# Patient Record
Sex: Female | Born: 1975 | Race: White | Hispanic: No | Marital: Married | Smoking: Never smoker
Health system: Southern US, Community
[De-identification: ages and names within clinical notes are randomized; demographics above are authoritative.]

## PROBLEM LIST (undated history)

## (undated) DIAGNOSIS — R140 Abdominal distension (gaseous): Secondary | ICD-10-CM

## (undated) DIAGNOSIS — K219 Gastro-esophageal reflux disease without esophagitis: Secondary | ICD-10-CM

## (undated) DIAGNOSIS — T7840XA Allergy, unspecified, initial encounter: Secondary | ICD-10-CM

## (undated) DIAGNOSIS — R197 Diarrhea, unspecified: Secondary | ICD-10-CM

## (undated) DIAGNOSIS — T883XXA Malignant hyperthermia due to anesthesia, initial encounter: Secondary | ICD-10-CM

## (undated) HISTORY — PX: DILATION AND CURETTAGE OF UTERUS: SHX78

## (undated) HISTORY — PX: TONSILLECTOMY: SUR1361

## (undated) HISTORY — DX: Allergy, unspecified, initial encounter: T78.40XA

## (undated) HISTORY — DX: Abdominal distension (gaseous): R14.0

## (undated) HISTORY — DX: Diarrhea, unspecified: R19.7

## (undated) HISTORY — DX: Gastro-esophageal reflux disease without esophagitis: K21.9

---

## 2017-03-13 ENCOUNTER — Other Ambulatory Visit: Payer: Self-pay | Admitting: Obstetrics and Gynecology

## 2017-03-13 DIAGNOSIS — R928 Other abnormal and inconclusive findings on diagnostic imaging of breast: Secondary | ICD-10-CM

## 2017-03-30 ENCOUNTER — Other Ambulatory Visit: Payer: Self-pay

## 2017-03-31 ENCOUNTER — Ambulatory Visit
Admission: RE | Admit: 2017-03-31 | Discharge: 2017-03-31 | Disposition: A | Discharge status: Home / Self Care | Payer: PRIVATE HEALTH INSURANCE | Source: Ambulatory Visit | Attending: Obstetrics and Gynecology | Admitting: Obstetrics and Gynecology

## 2017-03-31 DIAGNOSIS — R928 Other abnormal and inconclusive findings on diagnostic imaging of breast: Secondary | ICD-10-CM

## 2017-10-04 ENCOUNTER — Encounter (HOSPITAL_COMMUNITY): Payer: Self-pay | Admitting: *Deleted

## 2017-10-04 ENCOUNTER — Other Ambulatory Visit: Payer: Self-pay

## 2017-10-04 ENCOUNTER — Observation Stay (HOSPITAL_COMMUNITY)
Admission: EM | Admit: 2017-10-04 | Discharge: 2017-10-06 | Disposition: A | Discharge status: Home / Self Care | Payer: PRIVATE HEALTH INSURANCE | Attending: Internal Medicine | Admitting: Internal Medicine

## 2017-10-04 ENCOUNTER — Emergency Department (HOSPITAL_COMMUNITY): Payer: PRIVATE HEALTH INSURANCE

## 2017-10-04 DIAGNOSIS — R946 Abnormal results of thyroid function studies: Secondary | ICD-10-CM | POA: Insufficient documentation

## 2017-10-04 DIAGNOSIS — J341 Cyst and mucocele of nose and nasal sinus: Secondary | ICD-10-CM | POA: Diagnosis not present

## 2017-10-04 DIAGNOSIS — C4910 Malignant neoplasm of connective and soft tissue of unspecified upper limb, including shoulder: Secondary | ICD-10-CM | POA: Diagnosis present

## 2017-10-04 DIAGNOSIS — Z79899 Other long term (current) drug therapy: Secondary | ICD-10-CM | POA: Diagnosis not present

## 2017-10-04 DIAGNOSIS — M6282 Rhabdomyolysis: Principal | ICD-10-CM

## 2017-10-04 DIAGNOSIS — M79601 Pain in right arm: Secondary | ICD-10-CM

## 2017-10-04 HISTORY — DX: Malignant hyperthermia due to anesthesia, initial encounter: T88.3XXA

## 2017-10-04 LAB — CBC WITH DIFFERENTIAL/PLATELET
BASOS PCT: 0 %
Basophils Absolute: 0 10*3/uL (ref 0.0–0.1)
EOS ABS: 0.1 10*3/uL (ref 0.0–0.7)
EOS PCT: 3 %
HEMATOCRIT: 41.2 % (ref 36.0–46.0)
Hemoglobin: 13.5 g/dL (ref 12.0–15.0)
Lymphocytes Relative: 22 %
Lymphs Abs: 0.9 10*3/uL (ref 0.7–4.0)
MCH: 29.5 pg (ref 26.0–34.0)
MCHC: 32.8 g/dL (ref 30.0–36.0)
MCV: 90 fL (ref 78.0–100.0)
MONO ABS: 0.2 10*3/uL (ref 0.1–1.0)
Monocytes Relative: 5 %
Neutro Abs: 3 10*3/uL (ref 1.7–7.7)
Neutrophils Relative %: 70 %
PLATELETS: 235 10*3/uL (ref 150–400)
RBC: 4.58 MIL/uL (ref 3.87–5.11)
RDW: 13.8 % (ref 11.5–15.5)
WBC: 4.3 10*3/uL (ref 4.0–10.5)

## 2017-10-04 LAB — BASIC METABOLIC PANEL
ANION GAP: 11 (ref 5–15)
BUN: 13 mg/dL (ref 6–20)
CHLORIDE: 103 mmol/L (ref 101–111)
CO2: 26 mmol/L (ref 22–32)
Calcium: 9.3 mg/dL (ref 8.9–10.3)
Creatinine, Ser: 0.9 mg/dL (ref 0.44–1.00)
GFR calc non Af Amer: >60 mL/min (ref >60)
GLUCOSE: 99 mg/dL (ref 65–99)
POTASSIUM: 3.8 mmol/L (ref 3.5–5.1)
Sodium: 140 mmol/L (ref 135–145)

## 2017-10-04 LAB — URINALYSIS, ROUTINE W REFLEX MICROSCOPIC
Bilirubin Urine: NEGATIVE
Glucose, UA: NEGATIVE mg/dL
Hgb urine dipstick: NEGATIVE
Ketones, ur: NEGATIVE mg/dL
LEUKOCYTES UA: NEGATIVE
Nitrite: NEGATIVE
PH: 6 (ref 5.0–8.0)
Protein, ur: NEGATIVE mg/dL
SPECIFIC GRAVITY, URINE: 1.004 — AB (ref 1.005–1.030)

## 2017-10-04 LAB — CK: Total CK: 6227 U/L — ABNORMAL HIGH (ref 38–234)

## 2017-10-04 LAB — POC URINE PREG, ED: PREG TEST UR: NEGATIVE

## 2017-10-04 LAB — TSH: TSH: 7.014 u[IU]/mL — ABNORMAL HIGH (ref 0.350–4.500)

## 2017-10-04 MED ORDER — SODIUM CHLORIDE 0.9 % IV SOLN
INTRAVENOUS | Status: DC
  Administered 2017-10-04 – 2017-10-05 (×3): via INTRAVENOUS

## 2017-10-04 MED ORDER — ACETAMINOPHEN 650 MG RE SUPP
650.0000 mg | Freq: Four times a day (QID) | RECTAL | Status: DC | PRN
Start: 2017-10-04 — End: 2017-10-06

## 2017-10-04 MED ORDER — SODIUM CHLORIDE 0.9 % IV SOLN
Freq: Once | INTRAVENOUS | Status: AC
  Administered 2017-10-04: 14:00:00 via INTRAVENOUS

## 2017-10-04 MED ORDER — MORPHINE SULFATE (PF) 4 MG/ML IV SOLN: 2.0000 mg | INTRAVENOUS | Status: DC | PRN

## 2017-10-04 MED ORDER — POLYETHYLENE GLYCOL 3350 17 G PO PACK: 17.0000 g | PACK | Freq: Every day | ORAL | Status: DC | PRN

## 2017-10-04 MED ORDER — IOPAMIDOL (ISOVUE-300) INJECTION 61%
INTRAVENOUS | Status: AC
  Filled 2017-10-04: qty 100

## 2017-10-04 MED ORDER — SODIUM CHLORIDE 0.9 % IV SOLN
Freq: Once | INTRAVENOUS | Status: AC
Start: 2017-10-04 — End: 2017-10-04
  Administered 2017-10-04: 14:00:00 via INTRAVENOUS

## 2017-10-04 MED ORDER — ENOXAPARIN SODIUM 40 MG/0.4ML ~~LOC~~ SOLN
40.0000 mg | SUBCUTANEOUS | Status: DC
Start: 2017-10-04 — End: 2017-10-06
  Administered 2017-10-04 – 2017-10-05 (×2): 40 mg via SUBCUTANEOUS
  Filled 2017-10-04 (×2): qty 0.4

## 2017-10-04 MED ORDER — IOPAMIDOL (ISOVUE-300) INJECTION 61%
100.0000 mL | Freq: Once | INTRAVENOUS | Status: AC | PRN
  Administered 2017-10-04: 100 mL via INTRAVENOUS

## 2017-10-04 MED ORDER — ACETAMINOPHEN 325 MG PO TABS
650.0000 mg | ORAL_TABLET | Freq: Four times a day (QID) | ORAL | Status: DC | PRN
  Administered 2017-10-04 – 2017-10-05 (×2): 650 mg via ORAL
  Filled 2017-10-04 (×2): qty 2

## 2017-10-04 MED ORDER — TRAMADOL HCL 50 MG PO TABS
50.0000 mg | ORAL_TABLET | Freq: Four times a day (QID) | ORAL | Status: DC | PRN
  Administered 2017-10-04 – 2017-10-05 (×3): 50 mg via ORAL
  Filled 2017-10-04 (×3): qty 1

## 2017-10-04 NOTE — ED Notes (Signed)
ED Provider at bedside. 

## 2017-10-04 NOTE — ED Provider Notes (Signed)
[facility] DEPT Provider Note   CSN: [id number] Arrival date & time: 10/04/17  1048     History   Chief Complaint No chief complaint on file.   HPI Sandra Butler is a 42 y.o. female.  42 year old female presents with a several day history of worsening right upper extremity edema which is been rapidly progressive over the past several hours.  Did start a new workout regimen recently and did have some swelling to her left side which is improved.  Now notes progressive edema and now going down to her mid right forearm.  Denies any distal numbness or tingling in her right hand.  Does not take any birth control pills at this time.  No shortness of breath.  No paresthesias.  Symptoms progressively worse and nothing makes them better no treatment used prior to arrival.     No past medical history on file.  There are no active problems to display for this patient.      OB History   None      Home Medications    Prior to Admission medications   Not on File    Family History No family history on file.  Social History Social History   Tobacco Use  . Smoking status: Not on file  Substance Use Topics  . Alcohol use: Not on file  . Drug use: Not on file     Allergies   Patient has no allergy information on record.   Review of Systems Review of Systems  All other systems reviewed and are negative.    Physical Exam Updated Vital Signs BP 129/77 (BP Location: Left Arm)   Pulse 61   Temp 98.1 F (36.7 C) (Oral)   Resp 16   SpO2 98%   Physical Exam  Constitutional: She is oriented to person, place, and time. She appears well-developed and well-nourished.  Non-toxic appearance. No distress.  HENT:  Head: Normocephalic and atraumatic.  Eyes: Pupils are equal, round, and reactive to light. Conjunctivae, EOM and lids are normal.  Neck: Normal range of motion. Neck supple. No tracheal deviation present. No thyroid mass present.    Cardiovascular: Normal rate, regular rhythm and normal heart sounds. Exam reveals no gallop.  No murmur heard. Pulmonary/Chest: Effort normal and breath sounds normal. No stridor. No respiratory distress. She has no decreased breath sounds. She has no wheezes. She has no rhonchi. She has no rales.  Abdominal: Soft. Normal appearance and bowel sounds are normal. She exhibits no distension. There is no tenderness. There is no rebound and no CVA tenderness.  Musculoskeletal: Normal range of motion. She exhibits no edema or tenderness.       Arms: Neurological: She is alert and oriented to person, place, and time. She has normal strength. No cranial nerve deficit or sensory deficit. GCS eye subscore is 4. GCS verbal subscore is 5. GCS motor subscore is 6.  Skin: Skin is warm and dry. No abrasion and no rash noted.  Psychiatric: She has a normal mood and affect. Her speech is normal and behavior is normal.  Nursing note and vitals reviewed.    ED Treatments / Results  Labs (all labs ordered are listed, but only abnormal results are displayed) Labs Reviewed  CBC WITH DIFFERENTIAL/PLATELET  BASIC METABOLIC PANEL  CK    EKG None  Radiology No results found.  Procedures Procedures (including critical care time)  Medications Ordered in ED Medications - No data to display   Initial Impression /  Assessment and Plan / ED Course  I have reviewed the triage vital signs and the nursing notes.  Pertinent labs & imaging results that were available during my care of the patient were reviewed by me and considered in my medical decision making (see chart for details).    Patient ordered 2 L of IV saline here.  CT of arm shows no evidence of deep infection.  Has elevated CK and current symptomatology likely from early rhabdomyolysis.  She has no signs of vascular compromise at this time.  No signs of compartment syndrome.  Will admit to the hospital for observation  Final Clinical  Impressions(s) / ED Diagnoses   Final diagnoses:  None    ED Discharge Orders    None       Lacretia Leigh, MD 10/04/17 1351

## 2017-10-04 NOTE — ED Notes (Signed)
Bed: WA05 Expected date:  Expected time:  Means of arrival:  Comments: 

## 2017-10-04 NOTE — ED Notes (Signed)
ED TO INPATIENT HANDOFF REPORT  Name/Age/Gender Sandra Butler 42 y.o. female  Code Status   Home/SNF/Other Home  Chief Complaint arm pain   Level of Care/Admitting Diagnosis ED Disposition    ED Disposition Condition [facility] Area: [place] [[id number]]  Level of Care: Med-Surg [16]  Diagnosis: Rhabdomyosarcoma of arm, unspecified laterality [facility]) [[id number]]  Admitting Physician: Cristy Folks [[id number]]  Attending Physician: Cristy Folks [[id number]]  PT Class (Do Not Modify): Observation [104]  PT Acc Code (Do Not Modify): Observation [10022]       Medical History Past Medical History:  Diagnosis Date  . Malignant hyperthermia     Allergies Allergies  Allergen Reactions  . Succinylcholine Palpitations and Other (See Comments)    Lock Jaw    IV Location/Drains/Wounds Patient Lines/Drains/Airways Status   Active Line/Drains/Airways    Name:   Placement date:   Placement time:   Site:   Days:   Peripheral IV 10/04/17 Left Antecubital   10/04/17    1120    Antecubital   less than 1          Labs/Imaging Results for orders placed or performed during the hospital encounter of 10/04/17 (from the past 48 hour(s))  CBC with Differential/Platelet     Status: None   Collection Time: 10/04/17 10:51 AM  Result Value Ref Range   WBC 4.3 4.0 - 10.5 K/uL   RBC 4.58 3.87 - 5.11 MIL/uL   Hemoglobin 13.5 12.0 - 15.0 g/dL   HCT 41.2 36.0 - 46.0 %   MCV 90.0 78.0 - 100.0 fL   MCH 29.5 26.0 - 34.0 pg   MCHC 32.8 30.0 - 36.0 g/dL   RDW 13.8 11.5 - 15.5 %   Platelets 235 150 - 400 K/uL   Neutrophils Relative % 70 %   Neutro Abs 3.0 1.7 - 7.7 K/uL   Lymphocytes Relative 22 %   Lymphs Abs 0.9 0.7 - 4.0 K/uL   Monocytes Relative 5 %   Monocytes Absolute 0.2 0.1 - 1.0 K/uL   Eosinophils Relative 3 %   Eosinophils Absolute 0.1 0.0 - 0.7 K/uL   Basophils Relative 0 %   Basophils Absolute 0.0 0.0 - 0.1 K/uL    Comment: Performed at  [facility], [place] [address], [place], [place] [address]  Basic metabolic panel     Status: None   Collection Time: 10/04/17 10:51 AM  Result Value Ref Range   Sodium 140 135 - 145 mmol/L   Potassium 3.8 3.5 - 5.1 mmol/L   Chloride 103 101 - 111 mmol/L   CO2 26 22 - 32 mmol/L   Glucose, Bld 99 65 - 99 mg/dL   BUN 13 6 - 20 mg/dL   Creatinine, Ser 0.90 0.44 - 1.00 mg/dL   Calcium 9.3 8.9 - 10.3 mg/dL   GFR calc non Af Amer >60 >60 mL/min   GFR calc Af Amer >60 >60 mL/min    Comment: (NOTE) The eGFR has been calculated using the CKD EPI equation. This calculation has not been validated in all clinical situations. eGFR's persistently <60 mL/min signify possible Chronic Kidney Disease.    Anion gap 11 5 - 15    Comment: Performed at [facility], [place] [address] [place], [place] [address]  CK     Status: Abnormal   Collection Time: 10/04/17 10:51 AM  Result Value Ref Range   Total CK 6,227 (H) 38 - 234  U/L    Comment: RESULTS CONFIRMED BY MANUAL DILUTION Performed at [facility] [address], [place], [place] [address]   Urinalysis, Routine w reflex microscopic     Status: Abnormal   Collection Time: 10/04/17  1:15 PM  Result Value Ref Range   Color, Urine YELLOW YELLOW   APPearance CLEAR CLEAR   Specific Gravity, Urine 1.004 (L) 1.005 - 1.030   pH 6.0 5.0 - 8.0   Glucose, UA NEGATIVE NEGATIVE mg/dL   Hgb urine dipstick NEGATIVE NEGATIVE   Bilirubin Urine NEGATIVE NEGATIVE   Ketones, ur NEGATIVE NEGATIVE mg/dL   Protein, ur NEGATIVE NEGATIVE mg/dL   Nitrite NEGATIVE NEGATIVE   Leukocytes, UA NEGATIVE NEGATIVE    Comment: Performed at [facility] [address], [place],  [address]  POC Urine Pregnancy, ED (do NOT order at [facility])     Status: None   Collection Time: 10/04/17  1:35 PM  Result Value Ref Range   Preg Test, Ur NEGATIVE NEGATIVE    Comment:        THE SENSITIVITY OF  THIS METHODOLOGY IS >24 mIU/mL    Ct Extrem Up Entire Arm R W/cm  Result Date: 10/04/2017 CLINICAL DATA:  Left arm tightness and soreness after working out. EXAM: CT OF THE UPPER RIGHT EXTREMITY WITH CONTRAST TECHNIQUE: Multidetector CT imaging of the upper right extremity was performed according to the standard protocol following intravenous contrast administration. COMPARISON:  None. CONTRAST:  11m ISOVUE-300 IOPAMIDOL (ISOVUE-300) INJECTION 61% FINDINGS: Bones/Joint/Cartilage No acute fracture or dislocation. Bone mineralization is normal. The visualized cervical spine is unremarkable. Ligaments Suboptimally assessed by CT. Muscles and Tendons Swollen, edematous appearance of the triceps muscle. No muscle atrophy. Soft tissues Prominent soft tissue edema along the posterior elbow extending into the upper forearm. Edema also tracks superiorly along the anterolateral upper arm into the right axilla. No fluid collection or soft tissue mass. No subcutaneous emphysema. The right upper superficial and deep venous system appears patent. Left frontoethmoidal sinus disease. Large mucous retention cysts in the bilateral maxillary sinuses. The visualized lungs are clear. IMPRESSION: 1. Swollen, edematous appearance of the triceps muscle, consistent with myositis, likely reflecting rhabdomyolysis given clinical history and elevated creatinine kinase. 2. Associated prominent soft tissue edema of the right upper extremity extending to the upper forearm is likely related to underlying muscle injury. While infection could have a similar appearance, this is less likely given clinical history. No drainable fluid collection or subcutaneous gas. 3.  No acute osseous abnormality. These results were called by telephone at the time of interpretation on 10/04/2017 at 1:43 pm to Dr. ALacretia Leigh, who verbally acknowledged these results. Electronically Signed   By: WTitus DubinM.D.   On: 10/04/2017 13:56    Pending  Labs [employer](From admission, onward)   Start     Ordered   Signed and Held  HIV antibody (Routine Testing)  Once,   R     Signed and Held   Signed and HOccupational hygienistmorning,   R     Signed and Held   Signed and Held  CK  Tomorrow morning,   R     Signed and Held   Signed and Held  Magnesium  Tomorrow morning,   R     Signed and Held   Signed and Held  TSH  Add-on,   R     Signed and Held  Vitals/Pain Today's Vitals   10/04/17 1051 10/04/17 1052 10/04/17 1300 10/04/17 1511  BP: 129/77  (!) 145/76 139/86  Pulse: 61  (!) 54 (!) 56  Resp: 16  17 16   Temp: 98.1 F (36.7 C)     TempSrc: Oral     SpO2: 98%  100% 100%  Weight:  150 lb (68 kg)    Height:  5' 4"  (1.626 m)    PainSc:  5   5     Isolation Precautions No active isolations  Medications Medications  0.9 %  sodium chloride infusion ( Intravenous Transfusing/Transfer 10/04/17 1555)  0.9 %  sodium chloride infusion ( Intravenous Stopped 10/04/17 1513)  0.9 %  sodium chloride infusion ( Intravenous Stopped 10/04/17 1513)  iopamidol (ISOVUE-300) 61 % injection 100 mL (100 mLs Intravenous Contrast Given 10/04/17 1316)  0.9 %  sodium chloride infusion ( Intravenous Transfusing/Transfer 10/04/17 1554)    Mobility walks

## 2017-10-04 NOTE — H&P (Signed)
History and Physical    Sandra Butler EPP:[id number] DOB: [birth year] DOA: 10/04/2017  PCP: Fanny Bien, MD   Patient coming from: home   Chief Complaint: arm pain and swelling  HPI: Sandra Butler is a 42 y.o. female with medical history significant of seasonal allergies coming in with bilateral right greater than left arm pain and swelling.  Patient reports that on Monday approximately 6 days ago she started a new workout that involved a great deal of overhead presses using weights.  She reported that she worked up with these weights which are heavier than what she is normally used to using for about 45 minutes which was the duration of the class.  She reports feeling sore for approximately 2 days afterwards however her symptoms improved.  Then approximately 3 days ago she began to have worsening bilateral right greater than left achy arm pain.  She also noted some increasing tightness in her bilateral arms.  The symptoms persisted for the next 2 days despite oral NSAIDs.  She continued to have increasing swelling of her bilateral arms right greater than left as well as pain.  Today she noted some numbness and tingling down the extensor surface of his her right arm.  She presented to the ED for the symptoms.  She denies any fever, cough, congestion, rhinorrhea, abdominal pain, diarrhea, chest pain, syncope/presyncope, nausea, vomiting.  She has noted her urine is somewhat darker than usual.  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  ED Course: In the ED patient's vitals were fairly unremarkable.  Labs showed a normal CBC and BMP.  Her CK was greater than 6000.  Her UA showed a spec graph that was quite low.  Right upper extremity CT with contrast showed edematous triceps muscle consistent with exertional rhabdomyolysis.  Deep veins appear to be patent this exam.  Review of Systems: As per HPI  otherwise 10 point review of systems negative.    History reviewed. No pertinent past medical history.  Past Surgical History:  Procedure Laterality Date  . TONSILLECTOMY       reports that she has never smoked. She has never used smokeless tobacco. She reports that she drinks alcohol. She reports that she does not use drugs.  Allergies  Allergen Reactions  . Succinylcholine Palpitations and Other (See Comments)    Lock Jaw    No family history on file.   Prior to Admission medications   Medication Sig Start Date End Date Taking? Authorizing Provider  ibuprofen (ADVIL,MOTRIN) 200 MG tablet Take 400 mg by mouth every 6 (six) hours as needed for moderate pain.   Yes [provider]  loratadine (CLARITIN) 10 MG tablet Take 10 mg by mouth daily as needed for allergies.   Yes [provider]  Melatonin 3 MG TABS Take 3 mg by mouth at bedtime as needed (sleep).   Yes [provider]  montelukast (SINGULAIR) 10 MG tablet Take 10 mg by mouth daily as needed. allergies   Yes [provider]    Physical Exam: Vitals:   10/04/17 1051 10/04/17 1052 10/04/17 1300  BP: 129/77  (!) 145/76  Pulse: 61  (!) 54  Resp: 16  17  Temp: 98.1 F (36.7 C)    TempSrc: Oral    SpO2: 98%  100%  Weight:  68 kg (150 lb)   Height:  5\' 4"  (1.626 m)     Constitutional: NAD,  calm, comfortable Vitals:   10/04/17 1051 10/04/17 1052 10/04/17 1300  BP: 129/77  (!) 145/76  Pulse: 61  (!) 54  Resp: 16  17  Temp: 98.1 F (36.7 C)    TempSrc: Oral    SpO2: 98%  100%  Weight:  68 kg (150 lb)   Height:  5\' 4"  (1.626 m)    Eyes: Anicteric sclera ENMT: Dry mucous membranes, good dentition.  Neck: normal, supple Respiratory: clear to auscultation bilaterally, no wheezing, no crackles. Normal respiratory effort. No accessory muscle use.  Cardiovascular: 2 out of 6 systolic murmur heard best at left upper sternal border Abdomen: no tenderness, no masses palpated. No  hepatosplenomegaly. Bowel sounds positive.  Musculoskeletal: No lower extremity edema, right greater than left upper extremity edema confined primarily to the proximal arm.  Tender to palpation.  Intact radial pulses.  Good cap refill.  Difficult to palpate brachial pulse. Skin: no rashes on visible skin Neurologic: Grossly intact, moving all extremities Psychiatric: Normal judgment and insight. Alert and oriented x 3. Normal mood.     Labs on Admission: I have personally reviewed following labs and imaging studies  CBC: Recent Labs  Lab 10/04/17 1051  WBC 4.3  NEUTROABS 3.0  HGB 13.5  HCT 41.2  MCV 90.0  PLT 269   Basic Metabolic Panel: Recent Labs  Lab 10/04/17 1051  NA 140  K 3.8  CL 103  CO2 26  GLUCOSE 99  BUN 13  CREATININE 0.90  CALCIUM 9.3   GFR: Estimated Creatinine Clearance: 77.1 mL/min (by C-G formula based on SCr of 0.9 mg/dL). Liver Function Tests: No results for input(s): AST, ALT, ALKPHOS, BILITOT, PROT, ALBUMIN in the last 168 hours. No results for input(s): LIPASE, AMYLASE in the last 168 hours. No results for input(s): AMMONIA in the last 168 hours. Coagulation Profile: No results for input(s): INR, PROTIME in the last 168 hours. Cardiac Enzymes: Recent Labs  Lab 10/04/17 1051  CKTOTAL 6,227*   BNP (last 3 results) No results for input(s): PROBNP in the last 8760 hours. HbA1C: No results for input(s): HGBA1C in the last 72 hours. CBG: No results for input(s): GLUCAP in the last 168 hours. Lipid Profile: No results for input(s): CHOL, HDL, LDLCALC, TRIG, CHOLHDL, LDLDIRECT in the last 72 hours. Thyroid Function Tests: No results for input(s): TSH, T4TOTAL, FREET4, T3FREE, THYROIDAB in the last 72 hours. Anemia Panel: No results for input(s): VITAMINB12, FOLATE, FERRITIN, TIBC, IRON, RETICCTPCT in the last 72 hours. Urine analysis:    Component Value Date/Time   COLORURINE YELLOW 10/04/2017 [place] 10/04/2017 1315    LABSPEC 1.004 (L) 10/04/2017 1315   PHURINE 6.0 10/04/2017 [place] 10/04/2017 1315   HGBUR NEGATIVE 10/04/2017 [place] 10/04/2017 [place] 10/04/2017 1315   PROTEINUR NEGATIVE 10/04/2017 1315   NITRITE NEGATIVE 10/04/2017 1315   LEUKOCYTESUR NEGATIVE 10/04/2017 1315    Radiological Exams on Admission: Ct Extrem Up Entire Arm R W/cm  Result Date: 10/04/2017 CLINICAL DATA:  Left arm tightness and soreness after working out. EXAM: CT OF THE UPPER RIGHT EXTREMITY WITH CONTRAST TECHNIQUE: Multidetector CT imaging of the upper right extremity was performed according to the standard protocol following intravenous contrast administration. COMPARISON:  None. CONTRAST:  143mL ISOVUE-300 IOPAMIDOL (ISOVUE-300) INJECTION 61% FINDINGS: Bones/Joint/Cartilage No acute fracture or dislocation. Bone mineralization is normal. The visualized cervical spine is unremarkable. Ligaments Suboptimally assessed by CT. Muscles and Tendons Swollen, edematous appearance  of the triceps muscle. No muscle atrophy. Soft tissues Prominent soft tissue edema along the posterior elbow extending into the upper forearm. Edema also tracks superiorly along the anterolateral upper arm into the right axilla. No fluid collection or soft tissue mass. No subcutaneous emphysema. The right upper superficial and deep venous system appears patent. Left frontoethmoidal sinus disease. Large mucous retention cysts in the bilateral maxillary sinuses. The visualized lungs are clear. IMPRESSION: 1. Swollen, edematous appearance of the triceps muscle, consistent with myositis, likely reflecting rhabdomyolysis given clinical history and elevated creatinine kinase. 2. Associated prominent soft tissue edema of the right upper extremity extending to the upper forearm is likely related to underlying muscle injury. While infection could have a similar appearance, this is less likely given clinical history. No  drainable fluid collection or subcutaneous gas. 3.  No acute osseous abnormality. These results were called by telephone at the time of interpretation on 10/04/2017 at 1:43 pm to Dr. Lacretia Leigh , who verbally acknowledged these results. Electronically Signed   By: Titus Dubin M.D.   On: 10/04/2017 13:56    EKG: Independently reviewed.  None performed  Assessment/Plan Active Problems:   Rhabdomyolysis   #) Nontraumatic exertional rhabdomyolysis: Currently at this time it appears most likely that her symptoms are related to her new exercise regimen.  It is unclear why there was this delay in her symptomatology of over a week.  She denies any rash, fevers or lower extremity weakness or swelling making the likelihood of inflammatory myositis less.  Additionally she is the wrong age.  She has no evidence of a systemic infection making viral myositis including CMV, HIV, EBV less likely.  She does have a history of malignant hyperthermia with inhaled anesthetic however she denies any family history of muscle diseases or any personal history of any exertional weakness.  Currently she has intact radial pulses and good cap refill.  Her pain does not appear to be excessively out of proportion to her exam.  She does describe some radial nerve palsy on the right that is transient.  Currently low suspicion for compartment syndrome. -Check TSH and HIV -IV fluids 150 mL's an hour -Recheck CK tomorrow - Low threshold to discuss with orthopedics about evaluation for compartment syndrome. -Tramadol and morphine for pain control  Fluids: Gentle IV fluids Electrolytes: Monitor and supplement Nutrition: Regular diet  Prophylaxis: Enoxaparin  Disposition: Pending improved swelling and decreasing CK  Full code   Cristy Folks MD [facility]   If 7PM-7AM, please contact night-coverage [email or web address] Password [facility]  10/04/2017, 2:24 PM

## 2017-10-04 NOTE — ED Notes (Signed)
Hospitalist at bedside 

## 2017-10-04 NOTE — ED Notes (Signed)
Patient transported to CT 

## 2017-10-04 NOTE — Progress Notes (Signed)
Patient arrived on unit via stretcher from ED.  No family at bedside.  

## 2017-10-04 NOTE — ED Triage Notes (Signed)
Pt noticed left arm felt achy and tight starting last night. Pt states she started a different workout on Monday and was sore a couple of days.

## 2017-10-05 ENCOUNTER — Observation Stay (HOSPITAL_BASED_OUTPATIENT_CLINIC_OR_DEPARTMENT_OTHER): Payer: PRIVATE HEALTH INSURANCE

## 2017-10-05 DIAGNOSIS — M6282 Rhabdomyolysis: Secondary | ICD-10-CM | POA: Diagnosis not present

## 2017-10-05 DIAGNOSIS — R946 Abnormal results of thyroid function studies: Secondary | ICD-10-CM | POA: Diagnosis not present

## 2017-10-05 DIAGNOSIS — R609 Edema, unspecified: Secondary | ICD-10-CM | POA: Diagnosis not present

## 2017-10-05 DIAGNOSIS — R7989 Other specified abnormal findings of blood chemistry: Secondary | ICD-10-CM

## 2017-10-05 LAB — BASIC METABOLIC PANEL
Anion gap: 11 (ref 5–15)
BUN: 8 mg/dL (ref 6–20)
CO2: 19 mmol/L — ABNORMAL LOW (ref 22–32)
Chloride: 109 mmol/L (ref 101–111)
Creatinine, Ser: 0.68 mg/dL (ref 0.44–1.00)
GFR calc non Af Amer: >60 mL/min (ref >60)
Glucose, Bld: 81 mg/dL (ref 65–99)
Potassium: 4 mmol/L (ref 3.5–5.1)
Sodium: 139 mmol/L (ref 135–145)

## 2017-10-05 LAB — BASIC METABOLIC PANEL WITH GFR
Calcium: 8.1 mg/dL — ABNORMAL LOW (ref 8.9–10.3)
GFR calc Af Amer: >60 mL/min (ref >60)

## 2017-10-05 LAB — CK: Total CK: 3720 U/L — ABNORMAL HIGH (ref 38–234)

## 2017-10-05 LAB — T4, FREE: FREE T4: 0.85 ng/dL (ref 0.82–1.77)

## 2017-10-05 LAB — MAGNESIUM: Magnesium: 1.7 mg/dL (ref 1.7–2.4)

## 2017-10-05 LAB — HIV ANTIBODY (ROUTINE TESTING W REFLEX): HIV Screen 4th Generation wRfx: NONREACTIVE

## 2017-10-05 MED ORDER — FUROSEMIDE 40 MG PO TABS
40.0000 mg | ORAL_TABLET | Freq: Once | ORAL | Status: AC
  Administered 2017-10-05: 40 mg via ORAL
  Filled 2017-10-05: qty 1

## 2017-10-05 MED ORDER — FUROSEMIDE 10 MG/ML IJ SOLN: 20.0000 mg | Freq: Once | INTRAMUSCULAR | Status: DC

## 2017-10-05 NOTE — Progress Notes (Signed)
Pt reports left arm feels tighter this morning and reports tingling has returned. Reports tightness seems to be more upper bicep and shoulder area. Site appears slightly more edematous than earlier in the evening. IVF are infusing on this arm at 150, but patient has been keeping arm elevated on pillow through the night. Cap refill remains unchanged, <3 seconds, pulses strong to radial, fingers warm to touch and freely movable. Paged on call provider to alert of change. Will c/t monitor.

## 2017-10-05 NOTE — Progress Notes (Signed)
PROGRESS NOTE    Sandra Butler  SVX:[id number] DOB: 1975-06-17 DOA: 10/04/2017 PCP: Fanny Bien, MD   Brief Narrative:  HPI on 10/04/2017 by Dr. Thomes Dinning Sandra Butler is a 42 y.o. female with medical history significant of seasonal allergies coming in with bilateral right greater than left arm pain and swelling.  Patient reports that on Monday approximately 6 days ago she started a new workout that involved a great deal of overhead presses using weights.  She reported that she worked up with these weights which are heavier than what she is normally used to using for about 45 minutes which was the duration of the class.  She reports feeling sore for approximately 2 days afterwards however her symptoms improved.  Then approximately 3 days ago she began to have worsening bilateral right greater than left achy arm pain.  She also noted some increasing tightness in her bilateral arms.  The symptoms persisted for the next 2 days despite oral NSAIDs.  She continued to have increasing swelling of her bilateral arms right greater than left as well as pain.  Today she noted some numbness and tingling down the extensor surface of his her right arm.  She presented to the ED for the symptoms.  She denies any fever, cough, congestion, rhinorrhea, abdominal pain, diarrhea, chest pain, syncope/presyncope, nausea, vomiting.  She has noted her urine is somewhat darker than usual.  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  Assessment & Plan   Nontraumatic exertional rhabdomyolysis edema -Secondary to new exercise regimen -Presented with upper extremity swelling -CT R upper extremity: Swollen, edematous appearance of the triceps muscle, consistent with myositis reflecting rhabdomyolysis.  Soft tissue edema of the right upper extremity extending to the upper forearm likely underlying muscle injury.  No  drainable fluid collection or venous gas.  No acute osseous abnormality. -CK on admission 6227, improving.  Currently 3720 -Creatinine within normal limits -Will discontinue IV fluids -Obtained upper extremity ultrasound, negative for DVT -Encourage patient to keep arms elevated -Will give small dose IV Lasix and monitor closely -Currently no sensory or motor deficits -Continue pain control  Abnormal TSH -TSH 7.014, will obtain free T4 level  DVT Prophylaxis Lovenox  Code Status: Full  Family Communication: Husband at bedside  Disposition Plan: Observation.  We will continue to monitor for additional 24 hours.  Discharge pending improvement in CK as well as upper extremity swelling  Consultants None  Procedures  None  Antibiotics   Anti-infectives (From admission, onward)   None      Subjective:   Sandra Butler seen and examined today.  Patient continues to complain of upper extremity swelling.  Denies current chest pain, shortness breath, abdominal pain, nausea vomiting, diarrhea constipation, dizziness or headache.  Objective:   Vitals:   10/04/17 1625 10/04/17 2020 10/05/17 0534 10/05/17 1411  BP: (!) 148/71 (!) 154/68 126/82 (!) 146/77  Pulse: 64 62 (!) 56 (!) 55  Resp: 18 18 18    Temp: 97.7 F (36.5 C) 97.9 F (36.6 C) 98.3 F (36.8 C)   TempSrc: Oral Oral Oral   SpO2: 100% 100% 97% 100%  Weight:      Height: 5\' 4"  (1.626 m)       Intake/Output Summary (Last 24 hours) at 10/05/2017 1414 Last data filed at 10/05/2017 0636 Gross per 24 hour  Intake 2237.5 ml  Output 1 ml  Net 2236.5 ml  Filed Weights   10/04/17 1052  Weight: 68 kg (150 lb)    Exam  General: Well developed, well nourished, NAD, appears stated age  HEENT: NCAT, mucous membranes moist.   Neck: Supple  Cardiovascular: S1 S2 auscultated, no rubs, murmurs or gallops. Regular rate and rhythm.  Respiratory: Clear to auscultation bilaterally with equal chest rise  Abdomen: Soft,  nontender, nondistended, + bowel sounds  Extremities: warm dry without cyanosis clubbing. ++B/L Upper ext edema  Neuro: AAOx3, nonfocal  Psych: Normal affect and demeanor with intact judgement and insight   Data Reviewed: I have personally reviewed following labs and imaging studies  CBC: Recent Labs  Lab 10/04/17 1051  WBC 4.3  NEUTROABS 3.0  HGB 13.5  HCT 41.2  MCV 90.0  PLT 962   Basic Metabolic Panel: Recent Labs  Lab 10/04/17 1051 10/04/17 2202  NA 140 139  K 3.8 4.0  CL 103 109  CO2 26 19*  GLUCOSE 99 81  BUN 13 8  CREATININE 0.90 0.68  CALCIUM 9.3 8.1*  MG  --  1.7   GFR: Estimated Creatinine Clearance: 86.8 mL/min (by C-G formula based on SCr of 0.68 mg/dL). Liver Function Tests: No results for input(s): AST, ALT, ALKPHOS, BILITOT, PROT, ALBUMIN in the last 168 hours. No results for input(s): LIPASE, AMYLASE in the last 168 hours. No results for input(s): AMMONIA in the last 168 hours. Coagulation Profile: No results for input(s): INR, PROTIME in the last 168 hours. Cardiac Enzymes: Recent Labs  Lab 10/04/17 1051 10/04/17 2202  CKTOTAL 6,227* 3,720*   BNP (last 3 results) No results for input(s): PROBNP in the last 8760 hours. HbA1C: No results for input(s): HGBA1C in the last 72 hours. CBG: No results for input(s): GLUCAP in the last 168 hours. Lipid Profile: No results for input(s): CHOL, HDL, LDLCALC, TRIG, CHOLHDL, LDLDIRECT in the last 72 hours. Thyroid Function Tests: Recent Labs    10/04/17 2202  TSH 7.014*   Anemia Panel: No results for input(s): VITAMINB12, FOLATE, FERRITIN, TIBC, IRON, RETICCTPCT in the last 72 hours. Urine analysis:    Component Value Date/Time   COLORURINE YELLOW 10/04/2017 [place] 10/04/2017 1315   LABSPEC 1.004 (L) 10/04/2017 1315   PHURINE 6.0 10/04/2017 1315   GLUCOSEU NEGATIVE 10/04/2017 1315   HGBUR NEGATIVE 10/04/2017 [place] 10/04/2017 [place] 10/04/2017 1315   PROTEINUR NEGATIVE 10/04/2017 1315   NITRITE NEGATIVE 10/04/2017 1315   LEUKOCYTESUR NEGATIVE 10/04/2017 1315   Sepsis Labs: @LABRCNTIP (procalcitonin:4,lacticidven:4)  )No results found for this or any previous visit (from the past 240 hour(s)).    Radiology Studies: Ct Extrem Up Entire Arm R W/cm  Result Date: 10/04/2017 CLINICAL DATA:  Left arm tightness and soreness after working out. EXAM: CT OF THE UPPER RIGHT EXTREMITY WITH CONTRAST TECHNIQUE: Multidetector CT imaging of the upper right extremity was performed according to the standard protocol following intravenous contrast administration. COMPARISON:  None. CONTRAST:  117mL ISOVUE-300 IOPAMIDOL (ISOVUE-300) INJECTION 61% FINDINGS: Bones/Joint/Cartilage No acute fracture or dislocation. Bone mineralization is normal. The visualized cervical spine is unremarkable. Ligaments Suboptimally assessed by CT. Muscles and Tendons Swollen, edematous appearance of the triceps muscle. No muscle atrophy. Soft tissues Prominent soft tissue edema along the posterior elbow extending into the upper forearm. Edema also tracks superiorly along the anterolateral upper arm into the right axilla. No fluid collection or soft tissue mass. No subcutaneous emphysema. The right upper superficial and deep venous system appears  patent. Left frontoethmoidal sinus disease. Large mucous retention cysts in the bilateral maxillary sinuses. The visualized lungs are clear. IMPRESSION: 1. Swollen, edematous appearance of the triceps muscle, consistent with myositis, likely reflecting rhabdomyolysis given clinical history and elevated creatinine kinase. 2. Associated prominent soft tissue edema of the right upper extremity extending to the upper forearm is likely related to underlying muscle injury. While infection could have a similar appearance, this is less likely given clinical history. No drainable fluid collection or subcutaneous gas. 3.  No acute  osseous abnormality. These results were called by telephone at the time of interpretation on 10/04/2017 at 1:43 pm to Dr. Lacretia Leigh , who verbally acknowledged these results. Electronically Signed   By: Titus Dubin M.D.   On: 10/04/2017 13:56     Scheduled Meds: . enoxaparin (LOVENOX) injection  40 mg Subcutaneous Q24H  . furosemide  20 mg Intravenous Once   Continuous Infusions:   LOS: 0 days   Time Spent in minutes   30 minutes  Eddye Broxterman D.O. on 10/05/2017 at 2:14 PM  Between 7am to 7pm - Pager - [phone]  After 7pm go to [email or web address] - password [email or web address]  And look for the night coverage person covering for me after hours  [facility] Office  [phone]

## 2017-10-05 NOTE — Progress Notes (Signed)
Return call from on call provider and advised of patient concerns. MD Advised that IVF can be continued at this time and he will alert day MD to assess and proceed from there. Will c/t monitor.

## 2017-10-05 NOTE — Progress Notes (Signed)
*  Preliminary Results* Bilateral upper extremity venous duplex completed. Bilateral upper extremities are negative for deep and superficial vein thrombosis.  10/05/2017 11:51 AM  Maudry Mayhew, BS, RVT, RDCS, RDMS

## 2017-10-05 NOTE — Progress Notes (Signed)
Patient arms remain edematous. Patient reports appear essentially same as when admitted. Remain unchanged from when this writer assessed at start of shift. Reminded to elevate as necessary. Cap refill <3 sec. No s/s of compartment syndrome. Will c/t monitor.

## 2017-10-06 DIAGNOSIS — R946 Abnormal results of thyroid function studies: Secondary | ICD-10-CM | POA: Diagnosis not present

## 2017-10-06 DIAGNOSIS — M6282 Rhabdomyolysis: Secondary | ICD-10-CM | POA: Diagnosis not present

## 2017-10-06 LAB — CK: Total CK: 1617 U/L — ABNORMAL HIGH (ref 38–234)

## 2017-10-06 LAB — BASIC METABOLIC PANEL
Anion gap: 11 (ref 5–15)
BUN: 9 mg/dL (ref 6–20)
CHLORIDE: 99 mmol/L — AB (ref 101–111)
CO2: 26 mmol/L (ref 22–32)
Calcium: 9.2 mg/dL (ref 8.9–10.3)
Creatinine, Ser: 0.89 mg/dL (ref 0.44–1.00)
GFR calc Af Amer: >60 mL/min (ref >60)
GFR calc non Af Amer: >60 mL/min (ref >60)
GLUCOSE: 98 mg/dL (ref 65–99)
POTASSIUM: 3.8 mmol/L (ref 3.5–5.1)
Sodium: 136 mmol/L (ref 135–145)

## 2017-10-06 NOTE — Discharge Instructions (Signed)
Rhabdomyolysis Rhabdomyolysis is a condition that happens when muscle cells break down and release substances into the blood that can damage the kidneys. This condition happens because of damage to the muscles that move bones (skeletal muscle). When the skeletal muscles are damaged, substances inside the muscle cells go into the blood. One of these substances is a protein called myoglobin. Large amounts of myoglobin can cause kidney damage or kidney failure. Other substances that are released by muscle cells may upset the balance of the minerals (electrolytes) in your blood. This imbalance causes your blood to have too much acid (acidosis). What are the causes? This condition is caused by muscle damage. Muscle damage often happens because of:  Using your muscles too much.  An injury that crushes or squeezes a muscle too tightly.  Using illegal drugs, mainly cocaine.  Alcohol abuse.  Other possible causes include:  Prescription medicines, such as those that: ? Lower cholesterol (statins). ? Treat ADHD (attention deficit hyperactivity disorder) or help with weight loss (amphetamines). ? Treat pain (opiates).  Infections.  Muscle diseases that are passed down from parent to child (inherited).  High fever.  Heatstroke.  Not having enough fluids in your body (dehydration).  Seizures.  Surgery.  What increases the risk? This condition is more likely to develop in people who:  Have a family history of muscle disease.  Take part in extreme sports, such as running in marathons.  Have diabetes.  Are older.  Abuse drugs or alcohol.  What are the signs or symptoms? Symptoms of this condition vary. Some people have very few symptoms, and other people have many symptoms. The most common symptoms include:  Muscle pain and swelling.  Weak muscles.  Dark urine.  Feeling weak and tired.  Other symptoms include:  Nausea and vomiting.  Fever.  Pain in the abdomen.  Pain  in the joints.  Symptoms of complications from this condition include:  Heart rhythm that is not normal (arrhythmia).  Seizures.  Not urinating enough because of kidney failure.  Very low blood pressure (shock). Signs of shock include dizziness, blurry vision, and clammy skin.  Bleeding that is hard to stop or control.  How is this diagnosed? This condition is diagnosed based on your medical history, your symptoms, and a physical exam. Tests may also be done, including:  Blood tests.  Urine tests to check for myoglobin.  You may also have other tests to check for causes of muscle damage and to check for complications. How is this treated? Treatment for this condition helps to:  Make sure you have enough fluids in your body.  Lower the acid levels in your blood to reverse acidosis.  Protect your kidneys.  Treatment may include:  Fluids and medicines given through an IV tube that is inserted into one of your veins.  Medicines to lower acidosis or to bring back the balance of the minerals in your body.  Hemodialysis. This treatment uses an artificial kidney machine to filter your blood while you recover. You may have this if other treatments are not helping.  Follow these instructions at home:  Take over-the-counter and prescription medicines only as told by your health care provider.  Rest at home until your health care provider says that you can return to your normal activities.  Drink enough fluid to keep your urine clear or pale yellow.  Do not do activities that take a lot of effort (are strenuous). Ask your health care provider what level of exercise is safe   for you.  Do not abuse drugs or alcohol. If you are having problems with drug or alcohol use, ask your health care provider for help.  Keep all follow-up visits as told by your health care provider. This is important. Contact a health care provider if:  You start having symptoms of this condition after  treatment. Get help right away if:  You have a seizure.  You bleed easily or cannot control bleeding.  You cannot urinate.  You have chest pain.  You have trouble breathing. This information is not intended to replace advice given to you by your health care provider. Make sure you discuss any questions you have with your health care provider. Document Released: 04/17/2004 Document Revised: 02/15/2016 Document Reviewed: 02/15/2016 Elsevier Interactive Patient Education  2018 [employer]  

## 2017-10-06 NOTE — Discharge Summary (Signed)
Physician Discharge Summary  Sandra Butler TFT:[id number] DOB: 1975-11-24 DOA: 10/04/2017  PCP: Fanny Bien, MD  Admit date: 10/04/2017 Discharge date: 10/06/2017  Time spent: 45 minutes  Recommendations for Outpatient Follow-up:  Patient will be discharged to home.  Patient will need to follow up with primary care provider within one week of discharge, repeat CK and thyroid function tests.  Patient should continue medications as prescribed.  Patient should follow a regular diet.   Discharge Diagnoses:  Nontraumatic exertional rhabdomyolysis edema Abnormal TSH  Discharge Condition: Stable  Diet recommendation: regular  Filed Weights   10/04/17 1052  Weight: 68 kg (150 lb)    History of present illness:  on 10/04/2017 by Dr. Delanna Notice a 42 y.o.femalewith medical history significant ofseasonal allergies coming in with bilateral right greater than left arm pain and swelling. Patient reports that on Monday approximately 6 days ago she started a new workout that involved a great deal of overhead presses using weights. She reported that she worked up with these weights which are heavier than what she is normally used to using for about 45 minutes which was the duration of the class. She reports feeling sore for approximately 2 days afterwards however her symptoms improved. Then approximately 3 days ago she began to have worsening bilateral right greater than left achy arm pain. She also noted some increasing tightness in her bilateral arms. The symptoms persisted for the next 2 days despite oral NSAIDs. She continued to have increasing swelling of her bilateral arms right greater than left as well as pain. Today she noted some numbness and tingling down the extensor surface of his her right arm. She presented to the ED for the symptoms. She denies any fever, cough, congestion, rhinorrhea, abdominal pain, diarrhea, chest pain, syncope/presyncope, nausea,  vomiting. She has noted her urine is somewhat darker than usual. 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  Hospital Course:  Nontraumatic exertional rhabdomyolysis edema -Secondary to new exercise regimen -Presented with upper extremity swelling -CT R upper extremity: Swollen, edematous appearance of the triceps muscle, consistent with myositis reflecting rhabdomyolysis.  Soft tissue edema of the right upper extremity extending to the upper forearm likely underlying muscle injury.  No drainable fluid collection or venous gas.  No acute osseous abnormality. -CK on admission 6227, improving.  Currently 1617 -Creatinine within normal limits -Obtained upper extremity ultrasound, negative for DVT -Encourage patient to keep arms elevated -Edema improved today (patient was given one dose of oral lasix on 10/05/2017) -repeat CK in one week -advised patient to continue plenty of oral intake with electrolyte supplementation and to not exercise for the next week; ease into exercising when able  Abnormal TSH -TSH 7.014, FT4 0.85 (WNL) -repeat thyroid function studies as an outpatient   Procedures: Upper ext [place]  Consultations: None  Discharge Exam: Vitals:   10/05/17 2107 10/06/17 0557  BP: (!) 155/64 134/78  Pulse: (!) 57 61  Resp: 18 20  Temp: 98.1 F (36.7 C) 98.3 F (36.8 C)  SpO2: 100% 99%   She is feeling much better.  States she is ready to go home today.  Feels she is able to move her arms more today without soreness and is able to see more definition in her arms.  Denies current chest pain, shortness breath, abdominal pain, nausea or vomiting, diarrhea or constipation.   General: Well developed, well nourished, NAD, appears stated age  HEENT: NCAT, mucous membranes moist.  Neck: Supple  Cardiovascular: S1 S2 auscultated, no rubs, murmurs or gallops. Regular rate and  rhythm.  Respiratory: Clear to auscultation bilaterally with equal chest rise  Abdomen: Soft, nontender, nondistended, + bowel sounds  Extremities: warm dry without cyanosis clubbing or edema of LE. Mild edema UE B/L  Neuro: AAOx3, nonfocal  Skin: Without rashes exudates or nodules  Psych: Normal affect and demeanor with intact judgement and insight, pleasant  Discharge Instructions Discharge Instructions    Discharge instructions   Complete by:  As directed    Patient will be discharged to home.  Patient will need to follow up with primary care provider within one week of discharge, repeat CK and thyroid function tests.  Patient should continue medications as prescribed.  Patient should follow a regular diet. Remember to supplement electrolytes.     Allergies as of 10/06/2017      Reactions   Succinylcholine Palpitations, Other (See Comments)   Lock Jaw      Medication List    TAKE these medications   ibuprofen 200 MG tablet Commonly known as:  ADVIL,MOTRIN Take 400 mg by mouth every 6 (six) hours as needed for moderate pain.   loratadine 10 MG tablet Commonly known as:  CLARITIN Take 10 mg by mouth daily as needed for allergies.   Melatonin 3 MG Tabs Take 3 mg by mouth at bedtime as needed (sleep).   montelukast 10 MG tablet Commonly known as:  SINGULAIR Take 10 mg by mouth daily as needed. allergies      Allergies  Allergen Reactions  . Succinylcholine Palpitations and Other (See Comments)    Lock Jaw   Follow-up Information    Fanny Bien, MD. Schedule an appointment as soon as possible for a visit in 1 week(s).   Specialty:  Family Medicine Why:  Hospital follow up Contact information: [address] [place] [address] [phone]            The results of significant diagnostics from this hospitalization (including imaging, microbiology, ancillary and laboratory) are listed below for reference.    Significant Diagnostic  Studies: Ct Extrem Up Entire Arm R W/cm  Result Date: 10/04/2017 CLINICAL DATA:  Left arm tightness and soreness after working out. EXAM: CT OF THE UPPER RIGHT EXTREMITY WITH CONTRAST TECHNIQUE: Multidetector CT imaging of the upper right extremity was performed according to the standard protocol following intravenous contrast administration. COMPARISON:  None. CONTRAST:  163mL ISOVUE-300 IOPAMIDOL (ISOVUE-300) INJECTION 61% FINDINGS: Bones/Joint/Cartilage No acute fracture or dislocation. Bone mineralization is normal. The visualized cervical spine is unremarkable. Ligaments Suboptimally assessed by CT. Muscles and Tendons Swollen, edematous appearance of the triceps muscle. No muscle atrophy. Soft tissues Prominent soft tissue edema along the posterior elbow extending into the upper forearm. Edema also tracks superiorly along the anterolateral upper arm into the right axilla. No fluid collection or soft tissue mass. No subcutaneous emphysema. The right upper superficial and deep venous system appears patent. Left frontoethmoidal sinus disease. Large mucous retention cysts in the bilateral maxillary sinuses. The visualized lungs are clear. IMPRESSION: 1. Swollen, edematous appearance of the triceps muscle, consistent with myositis, likely reflecting rhabdomyolysis given clinical history and elevated creatinine kinase. 2. Associated prominent soft tissue edema of the right upper extremity extending to the upper forearm is likely related to underlying muscle injury. While infection could have a similar appearance, this is less likely given clinical history. No drainable fluid collection or subcutaneous gas.  3.  No acute osseous abnormality. These results were called by telephone at the time of interpretation on 10/04/2017 at 1:43 pm to Dr. Lacretia Leigh , who verbally acknowledged these results. Electronically Signed   By: Titus Dubin M.D.   On: 10/04/2017 13:56    Microbiology: No results found for this or  any previous visit (from the past 240 hour(s)).   Labs: Basic Metabolic Panel: Recent Labs  Lab 10/04/17 1051 10/04/17 2202 10/06/17 0602  NA 140 139 136  K 3.8 4.0 3.8  CL 103 109 99*  CO2 26 19* 26  GLUCOSE 99 81 98  BUN 13 8 9   CREATININE 0.90 0.68 0.89  CALCIUM 9.3 8.1* 9.2  MG  --  1.7  --    Liver Function Tests: No results for input(s): AST, ALT, ALKPHOS, BILITOT, PROT, ALBUMIN in the last 168 hours. No results for input(s): LIPASE, AMYLASE in the last 168 hours. No results for input(s): AMMONIA in the last 168 hours. CBC: Recent Labs  Lab 10/04/17 1051  WBC 4.3  NEUTROABS 3.0  HGB 13.5  HCT 41.2  MCV 90.0  PLT 235   Cardiac Enzymes: Recent Labs  Lab 10/04/17 1051 10/04/17 2202 10/06/17 0602  CKTOTAL 6,227* 3,720* 1,617*   BNP: BNP (last 3 results) No results for input(s): BNP in the last 8760 hours.  ProBNP (last 3 results) No results for input(s): PROBNP in the last 8760 hours.  CBG: No results for input(s): GLUCAP in the last 168 hours.     Signed:  Cristal Ford  [facility] 10/06/2017, 10:38 AM

## 2019-04-07 ENCOUNTER — Other Ambulatory Visit: Payer: Self-pay | Admitting: Family Medicine

## 2019-04-07 ENCOUNTER — Encounter: Payer: Self-pay | Admitting: Gastroenterology

## 2019-04-07 DIAGNOSIS — R109 Unspecified abdominal pain: Secondary | ICD-10-CM

## 2019-04-07 DIAGNOSIS — R198 Other specified symptoms and signs involving the digestive system and abdomen: Secondary | ICD-10-CM

## 2019-04-11 ENCOUNTER — Ambulatory Visit
Admission: RE | Admit: 2019-04-11 | Discharge: 2019-04-11 | Disposition: A | Discharge status: Home / Self Care | Payer: PRIVATE HEALTH INSURANCE | Source: Ambulatory Visit | Attending: Family Medicine | Admitting: Family Medicine

## 2019-04-11 DIAGNOSIS — R109 Unspecified abdominal pain: Secondary | ICD-10-CM

## 2019-04-11 DIAGNOSIS — R198 Other specified symptoms and signs involving the digestive system and abdomen: Secondary | ICD-10-CM

## 2019-05-23 NOTE — Progress Notes (Signed)
Referring Provider: Fanny Bien, MD Primary Care Physician:  Fanny Bien, MD  Reason for Consultation:  Changes in bowel habits   IMPRESSION:  Change in bowel habits Abnormal TSH in 2019    - TSH 7.014 10/04/17 No known family history of colon cancer or polyps  Differential of symptoms includes IBS, celiac disease, food intolerance (lactose, fructose, sucrose), SIBO, and even thyroid disorder given her abnormal TSH in 2019. Less likely IBD. Must also exclude polyp or mass.    PLAN: Trial of daily stool bulking agent such as Metamucil Colonoscopy  The nature of the procedure, as well as the risks, benefits, and alternatives were carefully and thoroughly reviewed with the patient. Ample time for discussion and questions allowed. The patient understood, was satisfied, and agreed to proceed.  Please see the "Patient Instructions" section for addition details about the plan.  HPI: Sandra Butler is a 44 y.o. female referred by Dr. Ernie Hew for changes in stool habits and patient concerned about colon cancer.  The history is obtained through the patient, review of her electronic health record, and records provided by Dr. Ernie Hew.  She has anxiety, insomnia, vitamin D deficiency, allergic rhinitis.    Several months of a change in bowel habits developing around August 2020. Stool is now a big "clump." Bowel movements are no longer formed.  No associated abdominal pain bloating or eructation Rare diarrhea, but this preceded her bowel habit change No blood or mucous Initially attributed to coffee and caffeine although symptoms persisted despite stopping coffee No identified food triggers Good appetite. Stable weight.  No other identified exacerbating or relieving features She has not tried any medications other than omeprazole  Tries to live a healthy lifestyle with exercise, high vegetable diet, and minimal wine/alcohol  She has a friend with stage IV colon cancer who was  diagnosed during evaluation for change in bowel habits.  Abdominal ultrasound 04/11/19: normal No prior endoscopic evaluation.  No prior colon cancer screening. TSH 7.014 10/04/17  No known family history of colon cancer or polyps. No family history of uterine/endometrial cancer, pancreatic cancer or gastric/stomach cancer.   Past Medical History:  Diagnosis Date  . Acid reflux   . Bloating   . Diarrhea   . Malignant hyperthermia     Past Surgical History:  Procedure Laterality Date  . DILATION AND CURETTAGE OF UTERUS    . TONSILLECTOMY      Current Outpatient Medications  Medication Sig Dispense Refill  . loratadine (CLARITIN) 10 MG tablet Take 10 mg by mouth daily as needed for allergies.    . Melatonin 3 MG TABS Take 3 mg by mouth at bedtime as needed (sleep).    Marland Kitchen omeprazole (PRILOSEC) 40 MG capsule Take 40 mg by mouth daily.     No current facility-administered medications for this visit.    Allergies as of 05/24/2019 - Review Complete 05/24/2019  Allergen Reaction Noted  . Succinylcholine Palpitations and Other (See Comments) 10/04/2017    Family History  Problem Relation Age of Onset  . Hypertension Father   . Heart disease Father   . Ulcers Father   . Diabetes Mother     Social History   Socioeconomic History  . Marital status: Married    Spouse name: Not on file  . Number of children: 2  . Years of education: Not on file  . Highest education level: Not on file  Occupational History  . Occupation: Stay At Home Mom  Tobacco Use  .  Smoking status: Never Smoker  . Smokeless tobacco: Never Used  Substance and Sexual Activity  . Alcohol use: Yes    Comment: social  . Drug use: Never  . Sexual activity: Not on file  Other Topics Concern  . Not on file  Social History Narrative  . Not on file   Social Determinants of Health   Financial Resource Strain:   . Difficulty of Paying Living Expenses: Not on file  Food Insecurity:   . Worried About  Charity fundraiser in the Last Year: Not on file  . Ran Out of Food in the Last Year: Not on file  Transportation Needs:   . Lack of Transportation (Medical): Not on file  . Lack of Transportation (Non-Medical): Not on file  Physical Activity:   . Days of Exercise per Week: Not on file  . Minutes of Exercise per Session: Not on file  Stress:   . Feeling of Stress : Not on file  Social Connections:   . Frequency of Communication with Friends and Family: Not on file  . Frequency of Social Gatherings with Friends and Family: Not on file  . Attends Religious Services: Not on file  . Active Member of Clubs or Organizations: Not on file  . Attends Archivist Meetings: Not on file  . Marital Status: Not on file  Intimate Partner Violence:   . Fear of Current or Ex-Partner: Not on file  . Emotionally Abused: Not on file  . Physically Abused: Not on file  . Sexually Abused: Not on file    Review of Systems: 12 system ROS is negative except as noted above except for sleeping problems associated with stress. Not sleeping well since March but one of her 67 year olds is not sleeping.    Physical Exam: General:   Alert,  well-nourished, pleasant and cooperative in NAD Head:  Normocephalic and atraumatic. Eyes:  Sclera clear, no icterus.   Conjunctiva pink. Ears:  Normal auditory acuity. Nose:  No deformity, discharge,  or lesions. Mouth:  No deformity or lesions.   Neck:  Supple; no masses or thyromegaly. Lungs:  Clear throughout to auscultation.   No wheezes. Heart:  Regular rate and rhythm; no murmurs. Abdomen:  Soft,nontender, nondistended, normal bowel sounds, no rebound or guarding. No hepatosplenomegaly.   Rectal:  Deferred  Msk:  Symmetrical. No boney deformities LAD: No inguinal or umbilical LAD Extremities:  No clubbing or edema. Neurologic:  Alert and  oriented x4;  grossly nonfocal Skin:  Intact without significant lesions or rashes. Psych:  Alert and cooperative.  Normal mood and affect.    Husein Guedes L. Tarri Glenn, MD, MPH 05/24/2019, 12:57 PM

## 2019-05-24 ENCOUNTER — Other Ambulatory Visit: Payer: Self-pay

## 2019-05-24 ENCOUNTER — Ambulatory Visit (INDEPENDENT_AMBULATORY_CARE_PROVIDER_SITE_OTHER): Payer: PRIVATE HEALTH INSURANCE | Admitting: Gastroenterology

## 2019-05-24 ENCOUNTER — Encounter: Payer: Self-pay | Admitting: Gastroenterology

## 2019-05-24 VITALS — Temp 98.1°F | Wt 152.5 lb

## 2019-05-24 DIAGNOSIS — R194 Change in bowel habit: Secondary | ICD-10-CM

## 2019-05-24 DIAGNOSIS — Z1159 Encounter for screening for other viral diseases: Secondary | ICD-10-CM | POA: Diagnosis not present

## 2019-05-24 NOTE — Patient Instructions (Signed)
Colonoscopy recommended given the change in your bowel habits.  Please add a daily stool bulking agent such as Metamucil to try to even out your stools prior to your colonoscopy.  Tips for colonoscopy:  - Stay well hydrated for 3-4 days prior to the exam. This reduces nausea and dehydration.  - To prevent skin/hemorrhoid irritation - prior to wiping, put A&Dointment or vaseline on the toilet paper. - Keep a towel or pad on the bed.  - Drink  64oz of clear liquids in the morning of prep day (prior to starting the prep) to be sure that there is enough fluid to flush the colon and stay hydrated!!!! This is in addition to the fluids required for preparation. - Use of a flavored hard candy, such as grape Anise Salvo, can counteract some of the flavor of the prep and may prevent some nausea.

## 2019-05-26 MED ORDER — NA SULFATE-K SULFATE-MG SULF 17.5-3.13-1.6 GM/177ML PO SOLN: 1.0000 | ORAL | 0 refills | Status: DC

## 2019-05-26 NOTE — Addendum Note (Signed)
Addended by: Wyline Beady on: 05/26/2019 10:32 AM   Modules accepted: Orders

## 2019-06-06 ENCOUNTER — Ambulatory Visit (INDEPENDENT_AMBULATORY_CARE_PROVIDER_SITE_OTHER): Payer: PRIVATE HEALTH INSURANCE

## 2019-06-06 ENCOUNTER — Other Ambulatory Visit: Payer: Self-pay | Admitting: Gastroenterology

## 2019-06-06 DIAGNOSIS — Z1159 Encounter for screening for other viral diseases: Secondary | ICD-10-CM

## 2019-06-06 LAB — SARS CORONAVIRUS 2 (TAT 6-24 HRS): SARS Coronavirus 2: NEGATIVE

## 2019-06-08 ENCOUNTER — Encounter: Payer: Self-pay | Admitting: Gastroenterology

## 2019-06-08 ENCOUNTER — Other Ambulatory Visit: Payer: Self-pay

## 2019-06-08 ENCOUNTER — Ambulatory Visit (AMBULATORY_SURGERY_CENTER): Payer: PRIVATE HEALTH INSURANCE | Admitting: Gastroenterology

## 2019-06-08 VITALS — BP 99/58 | HR 64 | Temp 98.3°F | Resp 14 | Ht 64.0 in | Wt 152.0 lb

## 2019-06-08 DIAGNOSIS — R194 Change in bowel habit: Secondary | ICD-10-CM

## 2019-06-08 MED ORDER — SODIUM CHLORIDE 0.9 % IV SOLN: 500.0000 mL | Freq: Once | INTRAVENOUS | Status: DC

## 2019-06-08 NOTE — Patient Instructions (Signed)
YOU HAD AN ENDOSCOPIC PROCEDURE TODAY AT THE [facility]:   Refer to the procedure report that was given to you for any specific questions about what was found during the examination.  If the procedure report does not answer your questions, please call your gastroenterologist to clarify.  If you requested that your care partner not be given the details of your procedure findings, then the procedure report has been included in a sealed envelope for you to review at your convenience later.  YOU SHOULD EXPECT: Some feelings of bloating in the abdomen. Passage of more gas than usual.  Walking can help get rid of the air that was put into your GI tract during the procedure and reduce the bloating. If you had a lower endoscopy (such as a colonoscopy or flexible sigmoidoscopy) you may notice spotting of blood in your stool or on the toilet paper. If you underwent a bowel prep for your procedure, you may not have a normal bowel movement for a few days.  Please Note:  You might notice some irritation and congestion in your nose or some drainage.  This is from the oxygen used during your procedure.  There is no need for concern and it should clear up in a day or so.  SYMPTOMS TO REPORT IMMEDIATELY:   Following lower endoscopy (colonoscopy or flexible sigmoidoscopy):  Excessive amounts of blood in the stool  Significant tenderness or worsening of abdominal pains  Swelling of the abdomen that is new, acute  Fever of 100F or higher  For urgent or emergent issues, a gastroenterologist can be reached at any hour by calling [phone].   DIET:  We do recommend a small meal at first, but then you may proceed to your regular diet.  Drink plenty of fluids but you should avoid alcoholic beverages for 24 hours.  ACTIVITY:  You should plan to take it easy for the rest of today and you should NOT DRIVE or use heavy machinery until tomorrow (because of the sedation medicines used during the test).     FOLLOW UP: Our staff will call the number listed on your records 48-72 hours following your procedure to check on you and address any questions or concerns that you may have regarding the information given to you following your procedure. If we do not reach you, we will leave a message.  We will attempt to reach you two times.  During this call, we will ask if you have developed any symptoms of COVID 19. If you develop any symptoms (ie: fever, flu-like symptoms, shortness of breath, cough etc.) before then, please call [phone].  If you test positive for Covid 19 in the 2 weeks post procedure, please call and report this information to us.    If any biopsies were taken you will be contacted by phone or by letter within the next 1-3 weeks.  Please call us at [phone] if you have not heard about the biopsies in 3 weeks.    SIGNATURES/CONFIDENTIALITY: You and/or your care partner have signed paperwork which will be entered into your electronic medical record.  These signatures attest to the fact that that the information above on your After Visit Summary has been reviewed and is understood.  Full responsibility of the confidentiality of this discharge information lies with you and/or your care-partner. 

## 2019-06-08 NOTE — Progress Notes (Signed)
Temp JB V/S CW 

## 2019-06-08 NOTE — Progress Notes (Signed)
Called to room to assist during endoscopic procedure.  Patient ID and intended procedure confirmed with present staff. Received instructions for my participation in the procedure from the performing physician.  

## 2019-06-08 NOTE — Progress Notes (Signed)
Report to PACU, RN, vss, BBS= Clear.  

## 2019-06-08 NOTE — Op Note (Addendum)
[place] Patient Name: Sandra Butler Procedure Date: 06/08/2019 8:29 AM MRN: [id number] Endoscopist: Thornton Park MD, MD Age: 44 Referring MD:  Date of Birth: November 16, 1975 Gender: Female Account #: [id number] Procedure:                Colonoscopy Indications:              Change in bowel habits, Change in stool caliber                           Change in bowel habits                           Abnormal TSH in 2019                           - TSH 7.014 10/04/17                           No known family history of colon cancer or polyps Medicines:                Monitored Anesthesia Care Procedure:                Pre-Anesthesia Assessment:                           - Prior to the procedure, a History and Physical                            was performed, and patient medications and                            allergies were reviewed. The patient's tolerance of                            previous anesthesia was also reviewed. The risks                            and benefits of the procedure and the sedation                            options and risks were discussed with the patient.                            All questions were answered, and informed consent                            was obtained. Prior Anticoagulants: The patient has                            taken no previous anticoagulant or antiplatelet                            agents. ASA Grade Assessment: II - A patient with  mild systemic disease. After reviewing the risks                            and benefits, the patient was deemed in                            satisfactory condition to undergo the procedure.                           After obtaining informed consent, the colonoscope                            was passed under direct vision. Throughout the                            procedure, the patient's blood pressure, pulse, and                            oxygen saturations  were monitored continuously. The                            Colonoscope was introduced through the anus and                            advanced to the the cecum, identified by                            appendiceal orifice and ileocecal valve. A second                            forward view of the right colon was performed. The                            colonoscopy was performed without difficulty. The                            patient tolerated the procedure well. The quality                            of the bowel preparation was excellent. The                            ileocecal valve, appendiceal orifice, and rectum                            were photographed. Scope In: 8:38:00 AM Scope Out: 8:48:47 AM Scope Withdrawal Time: 0 hours 7 minutes 54 seconds  Total Procedure Duration: 0 hours 10 minutes 47 seconds  Findings:                 The perianal and digital rectal examinations were                            normal.  Non-bleeding internal hemorrhoids were found. The                            hemorrhoids were small.                           The colon (entire examined portion) appeared                            normal. Biopsies for histology were taken with a                            cold forceps from the right colon and left colon                            for evaluation of microscopic colitis. Estimated                            blood loss was minimal.                           The exam was otherwise without abnormality on                            direct and retroflexion views. Complications:            No immediate complications. Estimated blood loss:                            Minimal. Estimated Blood Loss:     Estimated blood loss was minimal. Impression:               - Small non-bleeding internal hemorrhoids.                           - The entire examined colon is normal. Biopsied.                           - The examination was  otherwise normal on direct                            and retroflexion views. No obvious source for                            change in bowel habits/stool caliber identified.                            Will await biopsy results for microscopic colitis. Recommendation:           - Patient has a contact number available for                            emergencies. The signs and symptoms of potential                            delayed complications were discussed with  the                            patient. Return to normal activities tomorrow.                            Written discharge instructions were provided to the                            patient.                           - High fiber diet. Use a daily stool bulking agent                            such as Metamucil.                           - Continue present medications.                           - Await pathology results.                           - Repeat colonoscopy in 10 years for screening                            purposes, earlier with any new symptoms. Thornton Park MD, MD 06/08/2019 8:57:03 AM This report has been signed electronically.

## 2019-06-10 ENCOUNTER — Telehealth: Payer: Self-pay | Admitting: *Deleted

## 2019-06-10 ENCOUNTER — Telehealth: Payer: Self-pay

## 2019-06-10 NOTE — Telephone Encounter (Signed)
Attempted f/u phone call. No answer. Left message. °

## 2019-06-10 NOTE — Telephone Encounter (Signed)
  Follow up Call-  Call back number 06/08/2019  Post procedure Call Back phone  # [phone]  Permission to leave phone message Yes     Patient questions:  Do you have a fever, pain , or abdominal swelling? No. Pain Score  0 *  Have you tolerated food without any problems? Yes.    Have you been able to return to your normal activities? Yes.    Do you have any questions about your discharge instructions: Diet   No. Medications  No. Follow up visit  No.  Do you have questions or concerns about your Care? No.  Actions: * If pain score is 4 or above: No action needed, pain <4.  1. Have you developed a fever since your procedure? no  2.   Have you had an respiratory symptoms (SOB or cough) since your procedure? no  3.   Have you tested positive for COVID 19 since your procedure no  4.   Have you had any family members/close contacts diagnosed with the COVID 19 since your procedure?  no   If yes to any of these questions please route to Joylene John, RN and Alphonsa Gin, Therapist, sports.

## 2019-06-20 ENCOUNTER — Other Ambulatory Visit: Payer: Self-pay | Admitting: Obstetrics and Gynecology

## 2019-06-20 DIAGNOSIS — R928 Other abnormal and inconclusive findings on diagnostic imaging of breast: Secondary | ICD-10-CM

## 2019-06-30 ENCOUNTER — Other Ambulatory Visit: Payer: Self-pay

## 2019-06-30 ENCOUNTER — Ambulatory Visit
Admission: RE | Admit: 2019-06-30 | Discharge: 2019-06-30 | Disposition: A | Discharge status: Home / Self Care | Payer: PRIVATE HEALTH INSURANCE | Source: Ambulatory Visit | Attending: Obstetrics and Gynecology | Admitting: Obstetrics and Gynecology

## 2019-06-30 DIAGNOSIS — R928 Other abnormal and inconclusive findings on diagnostic imaging of breast: Secondary | ICD-10-CM

## 2020-09-17 ENCOUNTER — Other Ambulatory Visit: Payer: Self-pay | Admitting: Obstetrics and Gynecology

## 2020-09-17 DIAGNOSIS — R928 Other abnormal and inconclusive findings on diagnostic imaging of breast: Secondary | ICD-10-CM

## 2020-10-05 ENCOUNTER — Ambulatory Visit: Payer: PRIVATE HEALTH INSURANCE

## 2020-10-05 ENCOUNTER — Ambulatory Visit
Admission: RE | Admit: 2020-10-05 | Discharge: 2020-10-05 | Disposition: A | Discharge status: Home / Self Care | Payer: PRIVATE HEALTH INSURANCE | Source: Ambulatory Visit | Attending: Obstetrics and Gynecology | Admitting: Obstetrics and Gynecology

## 2020-10-05 ENCOUNTER — Ambulatory Visit: Admission: RE | Admit: 2020-10-05 | Payer: PRIVATE HEALTH INSURANCE | Source: Ambulatory Visit

## 2020-10-05 ENCOUNTER — Other Ambulatory Visit: Payer: Self-pay

## 2020-10-05 DIAGNOSIS — R928 Other abnormal and inconclusive findings on diagnostic imaging of breast: Secondary | ICD-10-CM

## 2023-03-08 IMAGING — MG DIGITAL DIAGNOSTIC BILAT W/ TOMO W/ CAD
8 of 11 series · 8 of 27 positions shown · non-contrast
Comparison: Previous exams including recent screening mammogram
dated 09/13/2020

CLINICAL DATA: Patient returns today to evaluate RIGHT breast
calcifications with possible asymmetry identified on recent
screening mammogram.

Patient also returns today to evaluate a possible LEFT breast
asymmetry questioned on same recent screening mammogram.
EXAM:
DIGITAL DIAGNOSTIC BILATERAL MAMMOGRAM WITH TOMOSYNTHESIS AND CAD
TECHNIQUE: Bilateral digital diagnostic mammography and breast tomosynthesis
was performed. The images were evaluated with computer-aided
detection.

[R ML (1 of 2)]
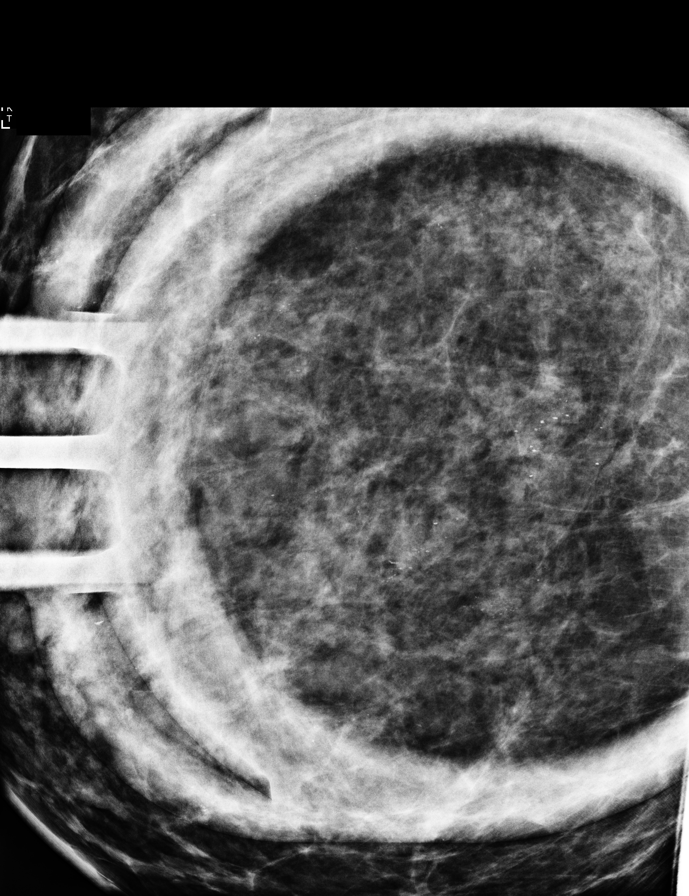

[R ML (2 of 2)]
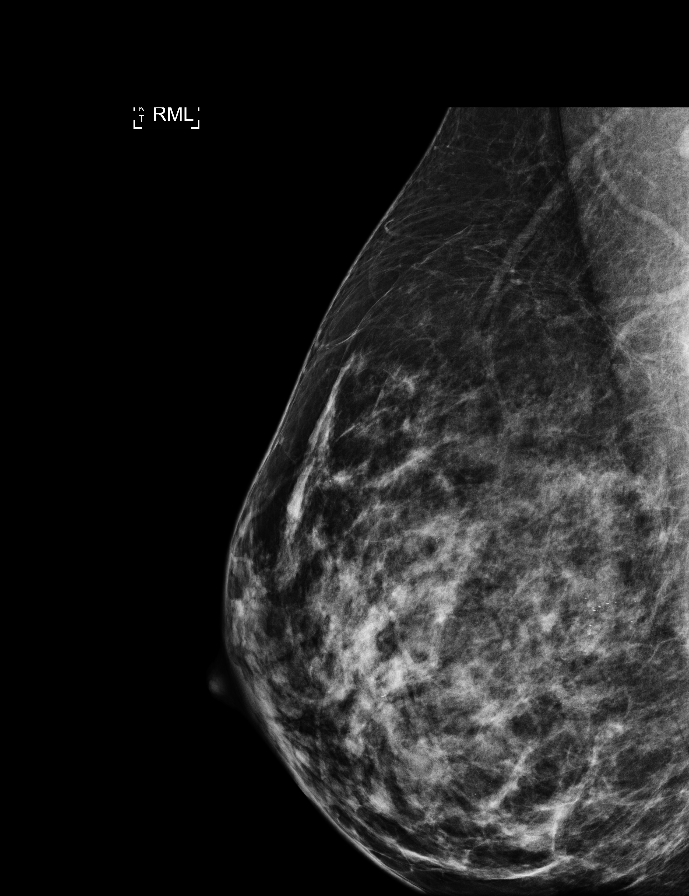

[R CC]
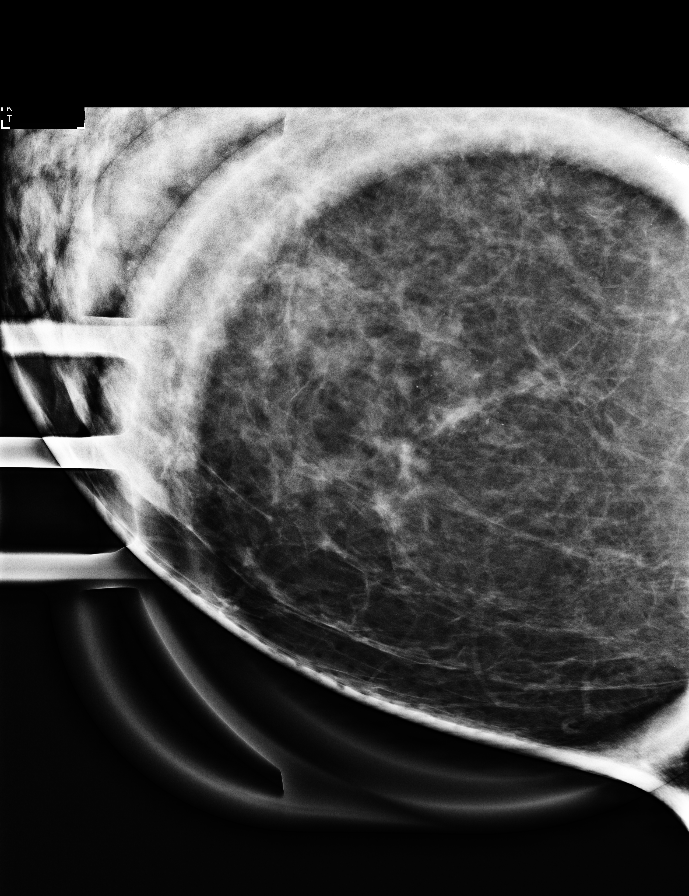

[L CC synth-2D]
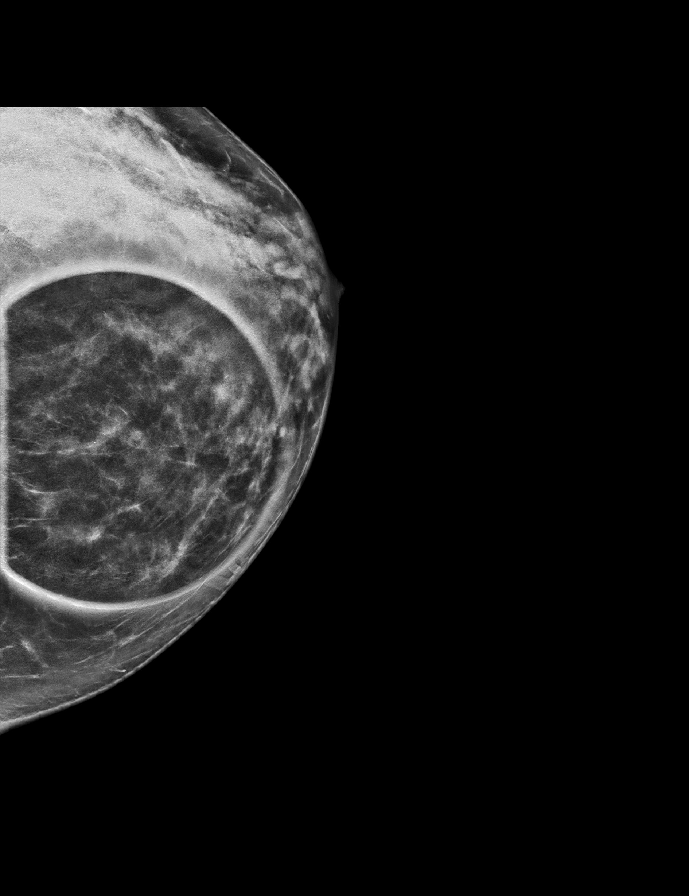

[R CC synth-2D]
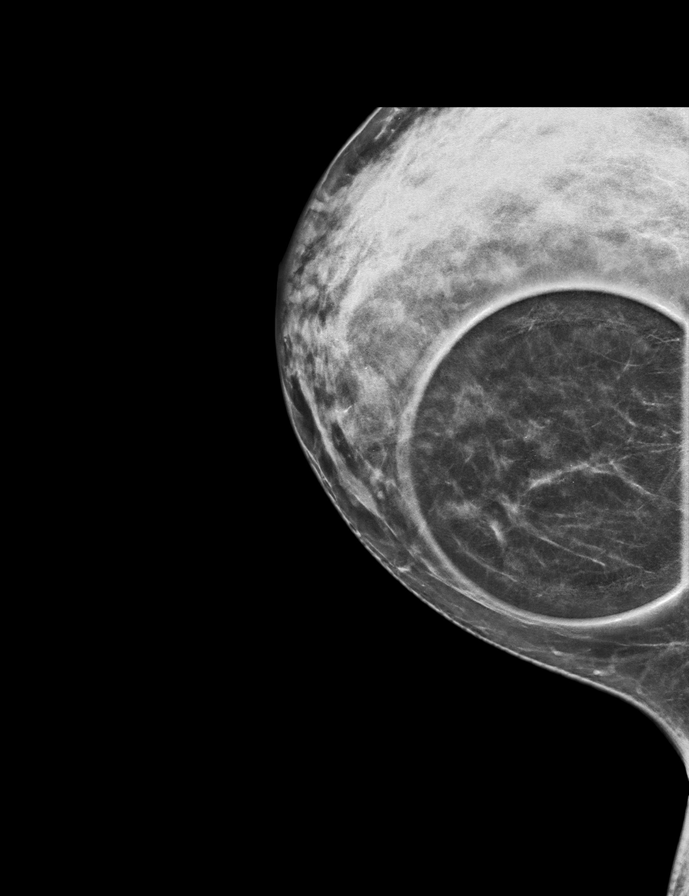

[L ML synth-2D]
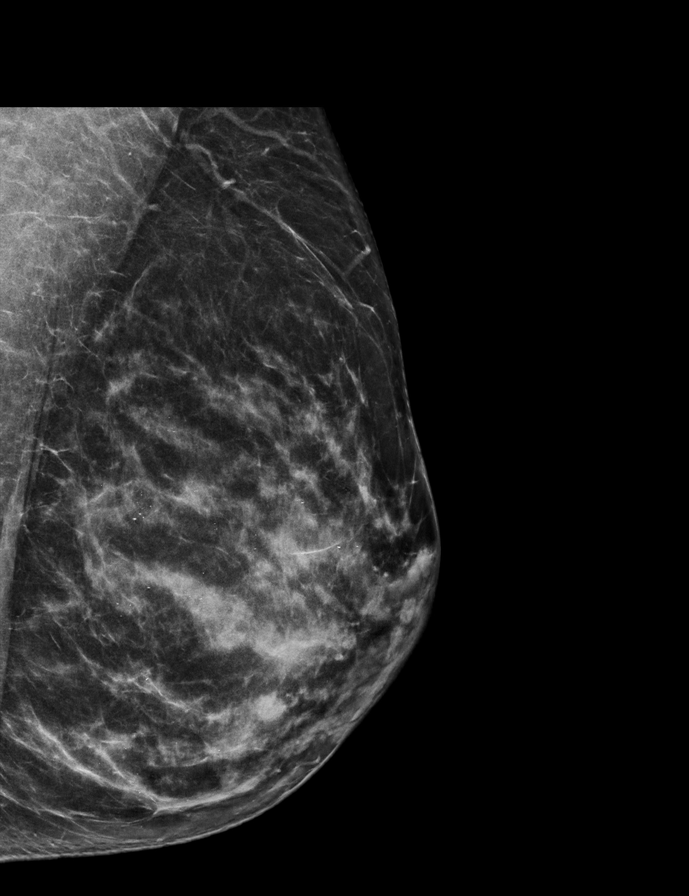

[R MLO synth-2D]
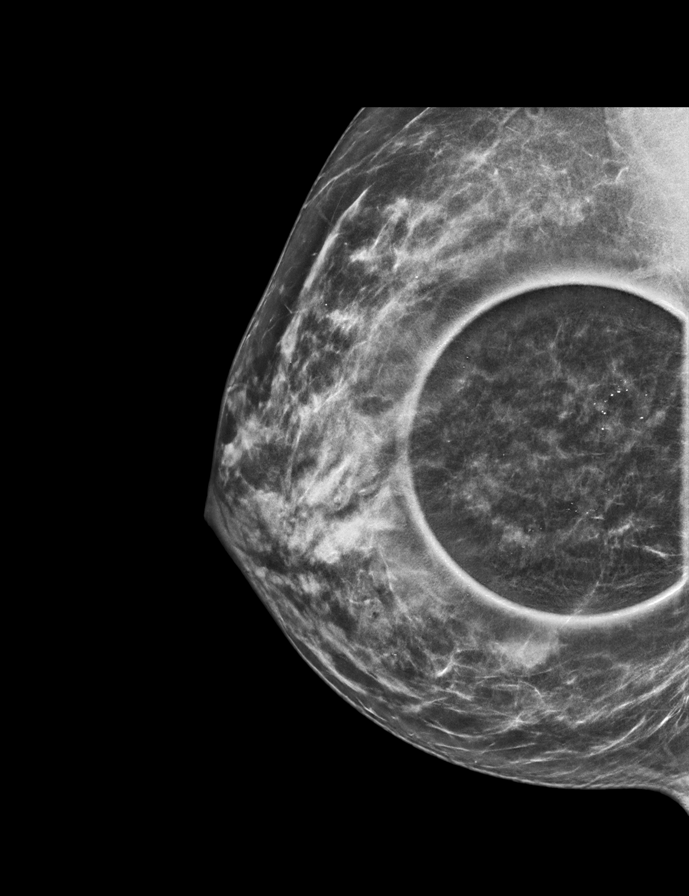

[R CC tomo · tomo slice 29/57.0]
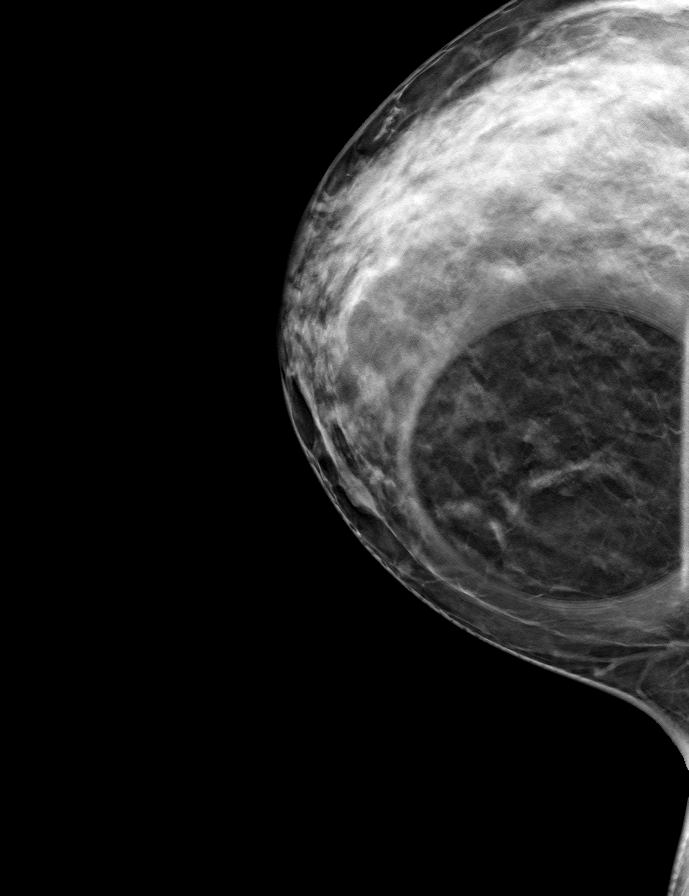

[8 of 27 positions shown; findings below may reference images not displayed]

ACR Breast Density Category c: The breast tissue is heterogeneously
dense, which may obscure small masses.
FINDINGS: RIGHT breast diagnostic mammogram: There are loosely grouped
calcifications confirmed within the inner RIGHT breast which have a
benign layering appearance on the lateral view indicating benign
milk of calcium/fibrocystic change. On today's additional diagnostic
views with spot compression and 3D tomosynthesis, there is no
underlying asymmetry.

LEFT breast diagnostic mammogram: On today's additional diagnostic
views, including spot compression with 3D tomosynthesis, there is no
persistent asymmetry within the inner LEFT breast indicating
superimposition of normal fibroglandular tissues.
IMPRESSION: No evidence of malignancy within either breast. Benign
calcifications within the RIGHT breast, compatible with benign milk
of calcium/fibrocystic change.

Patient may return to routine annual bilateral screening mammogram
schedule.

RECOMMENDATION:
Screening mammogram in one year.(Code:ME-1-IDT)

I have discussed the findings and recommendations with the patient.
If applicable, a reminder letter will be sent to the patient
regarding the next appointment.

BI-RADS CATEGORY  2: Benign.
